# Patient Record
Sex: Female | Born: 1985 | Race: White | Hispanic: No | Marital: Single | Smoking: Never smoker
Health system: Southern US, Community
[De-identification: ages and names within clinical notes are randomized; demographics above are authoritative.]

## PROBLEM LIST (undated history)

## (undated) DIAGNOSIS — F419 Anxiety disorder, unspecified: Secondary | ICD-10-CM

---

## 2010-01-31 HISTORY — PX: WISDOM TOOTH EXTRACTION: SHX21

## 2017-01-31 HISTORY — PX: REDUCTION MAMMAPLASTY: SUR839

## 2018-08-01 HISTORY — PX: BREAST REDUCTION SURGERY: SHX8

## 2018-12-29 ENCOUNTER — Other Ambulatory Visit: Payer: Self-pay

## 2018-12-29 ENCOUNTER — Emergency Department (HOSPITAL_COMMUNITY)
Admission: EM | Admit: 2018-12-29 | Discharge: 2018-12-29 | Disposition: A | Discharge status: Home / Self Care | Payer: BC Managed Care – PPO | Attending: Emergency Medicine | Admitting: Emergency Medicine

## 2018-12-29 ENCOUNTER — Emergency Department (HOSPITAL_COMMUNITY): Payer: BC Managed Care – PPO

## 2018-12-29 ENCOUNTER — Encounter (HOSPITAL_COMMUNITY): Payer: Self-pay

## 2018-12-29 DIAGNOSIS — S060X1A Concussion with loss of consciousness of 30 minutes or less, initial encounter: Secondary | ICD-10-CM | POA: Diagnosis not present

## 2018-12-29 DIAGNOSIS — S6291XA Unspecified fracture of right wrist and hand, initial encounter for closed fracture: Secondary | ICD-10-CM

## 2018-12-29 DIAGNOSIS — Z23 Encounter for immunization: Secondary | ICD-10-CM | POA: Diagnosis not present

## 2018-12-29 DIAGNOSIS — T148XXA Other injury of unspecified body region, initial encounter: Secondary | ICD-10-CM | POA: Diagnosis not present

## 2018-12-29 DIAGNOSIS — Y9355 Activity, bike riding: Secondary | ICD-10-CM | POA: Insufficient documentation

## 2018-12-29 DIAGNOSIS — Y9289 Other specified places as the place of occurrence of the external cause: Secondary | ICD-10-CM | POA: Diagnosis not present

## 2018-12-29 DIAGNOSIS — S0990XA Unspecified injury of head, initial encounter: Secondary | ICD-10-CM | POA: Insufficient documentation

## 2018-12-29 DIAGNOSIS — Z79899 Other long term (current) drug therapy: Secondary | ICD-10-CM | POA: Diagnosis not present

## 2018-12-29 DIAGNOSIS — S62314A Displaced fracture of base of fourth metacarpal bone, right hand, initial encounter for closed fracture: Secondary | ICD-10-CM | POA: Diagnosis not present

## 2018-12-29 DIAGNOSIS — Y999 Unspecified external cause status: Secondary | ICD-10-CM | POA: Diagnosis not present

## 2018-12-29 DIAGNOSIS — T07XXXA Unspecified multiple injuries, initial encounter: Secondary | ICD-10-CM

## 2018-12-29 DIAGNOSIS — S060XAA Concussion with loss of consciousness status unknown, initial encounter: Secondary | ICD-10-CM

## 2018-12-29 DIAGNOSIS — S060X9A Concussion with loss of consciousness of unspecified duration, initial encounter: Secondary | ICD-10-CM

## 2018-12-29 HISTORY — DX: Concussion with loss of consciousness of unspecified duration, initial encounter: S06.0X9A

## 2018-12-29 HISTORY — DX: Concussion with loss of consciousness status unknown, initial encounter: S06.0XAA

## 2018-12-29 HISTORY — DX: Anxiety disorder, unspecified: F41.9

## 2018-12-29 MED ORDER — HYDROCODONE-ACETAMINOPHEN 5-325 MG PO TABS: 1.0000 | ORAL_TABLET | ORAL | 0 refills | Status: AC | PRN

## 2018-12-29 MED ORDER — TETANUS-DIPHTH-ACELL PERTUSSIS 5-2.5-18.5 LF-MCG/0.5 IM SUSP
0.5000 mL | Freq: Once | INTRAMUSCULAR | Status: AC
  Administered 2018-12-29: 0.5 mL via INTRAMUSCULAR
  Filled 2018-12-29: qty 0.5
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
## 2018-12-29 NOTE — ED Notes (Signed)
Patient transported to X-ray 

## 2018-12-29 NOTE — Discharge Instructions (Addendum)
Testing did not show any serious problems.  You do appear to have had a concussion, following the head injury.  Avoid stress, watching video screens, and make sure you are eating and drinking well.  Call the neurologist for a follow-up appointment regarding the concussion.  Call the orthopedic doctor to be seen about the hand fracture.  Use Tylenol or Motrin for pain, and the narcotic pain reliever if needed.  Return here, if needed, for problems.

## 2018-12-29 NOTE — Progress Notes (Signed)
Orthopedic Tech Progress Note Patient Details:  Janice Gilbert 15-Jan-1986 [id number]  Ortho Devices Type of Ortho Device: Ace wrap, Ulna gutter splint Ortho Device/Splint Location: right Ortho Device/Splint Interventions: Application   Post Interventions Patient Tolerated: Well Instructions Provided: Care of device   [place] Pink 12/29/2018, 7:05 PM
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
## 2019-01-01 ENCOUNTER — Other Ambulatory Visit: Payer: Self-pay

## 2019-01-01 ENCOUNTER — Encounter: Payer: Self-pay | Admitting: Diagnostic Neuroimaging

## 2019-01-01 ENCOUNTER — Ambulatory Visit: Payer: BC Managed Care – PPO | Admitting: Diagnostic Neuroimaging

## 2019-01-01 VITALS — BP 141/93 | HR 90 | Temp 97.8°F | Ht 68.0 in | Wt 183.4 lb

## 2019-01-01 DIAGNOSIS — S060X1A Concussion with loss of consciousness of 30 minutes or less, initial encounter: Secondary | ICD-10-CM
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
## 2019-01-01 NOTE — Patient Instructions (Signed)
-   gradually increase activity as tolerated  - return to work on Jan 14, 2019 (or earlier if symptoms improve)

## 2019-01-03 ENCOUNTER — Encounter: Payer: Self-pay | Admitting: Orthopaedic Surgery

## 2019-01-03 ENCOUNTER — Ambulatory Visit (INDEPENDENT_AMBULATORY_CARE_PROVIDER_SITE_OTHER): Payer: BC Managed Care – PPO | Admitting: Orthopaedic Surgery

## 2019-01-03 ENCOUNTER — Other Ambulatory Visit: Payer: Self-pay

## 2019-01-03 DIAGNOSIS — S62316A Displaced fracture of base of fifth metacarpal bone, right hand, initial encounter for closed fracture: Secondary | ICD-10-CM
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
## 2019-02-05 ENCOUNTER — Ambulatory Visit: Payer: BC Managed Care – PPO | Admitting: Orthopaedic Surgery

## 2019-02-05 ENCOUNTER — Ambulatory Visit: Payer: Self-pay

## 2019-02-05 ENCOUNTER — Other Ambulatory Visit: Payer: Self-pay

## 2019-02-05 DIAGNOSIS — S62316A Displaced fracture of base of fifth metacarpal bone, right hand, initial encounter for closed fracture: Secondary | ICD-10-CM | POA: Diagnosis not present
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
## 2019-03-31 ENCOUNTER — Ambulatory Visit: Payer: BC Managed Care – PPO | Attending: Internal Medicine

## 2019-03-31 DIAGNOSIS — Z23 Encounter for immunization: Secondary | ICD-10-CM

## 2019-03-31 NOTE — Progress Notes (Signed)
   Covid-19 Vaccination Clinic  Name:  Janice Gilbert    MRN: [id number] DOB: 12-Mar-1985  03/31/2019  [name] was observed post Covid-19 immunization for 15 minutes without incidence. She was provided with Vaccine Information Sheet and instruction to access the V-Safe system.   [name] was instructed to call 911 with any severe reactions post vaccine: Marland Kitchen Difficulty breathing  . Swelling of your face and throat  . A fast heartbeat  . A bad rash all over your body  . Dizziness and weakness    Immunizations Administered    Name Date Dose VIS Date Route   Pfizer COVID-19 Vaccine 03/31/2019 10:35 AM 0.3 mL 01/11/2019 Intramuscular   Manufacturer: [employer], [employer]   Lot: [id number]   NDC: [address]-5

## 2019-04-22 ENCOUNTER — Ambulatory Visit: Payer: BC Managed Care – PPO | Attending: Internal Medicine

## 2019-04-22 DIAGNOSIS — Z23 Encounter for immunization: Secondary | ICD-10-CM

## 2019-04-22 NOTE — Progress Notes (Signed)
   Covid-19 Vaccination Clinic  Name:  Janice Gilbert    MRN: [id number] DOB: [birth year]  04/22/2019  [name] was observed post Covid-19 immunization for 15 minutes without incident. She was provided with Vaccine Information Sheet and instruction to access the V-Safe system.   [name] was instructed to call 911 with any severe reactions post vaccine: Marland Kitchen Difficulty breathing  . Swelling of face and throat  . A fast heartbeat  . A bad rash all over body  . Dizziness and weakness   Immunizations Administered    Name Date Dose VIS Date Route   Pfizer COVID-19 Vaccine 04/22/2019 10:00 AM 0.3 mL 01/11/2019 Intramuscular   Manufacturer: [employer], [employer]   Lot: [id number]   NDC: [address]-9

## 2020-03-12 ENCOUNTER — Other Ambulatory Visit: Payer: Self-pay | Admitting: Family Medicine

## 2020-03-12 DIAGNOSIS — N6310 Unspecified lump in the right breast, unspecified quadrant: Secondary | ICD-10-CM

## 2020-03-12 DIAGNOSIS — N6320 Unspecified lump in the left breast, unspecified quadrant: Secondary | ICD-10-CM

## 2020-03-12 DIAGNOSIS — Z1231 Encounter for screening mammogram for malignant neoplasm of breast: Secondary | ICD-10-CM

## 2020-03-20 ENCOUNTER — Inpatient Hospital Stay
Admission: RE | Admit: 2020-03-20 | Discharge: 2020-03-20 | Disposition: A | Discharge status: Home / Self Care | Payer: Self-pay | Source: Ambulatory Visit | Attending: *Deleted | Admitting: *Deleted

## 2020-03-20 ENCOUNTER — Other Ambulatory Visit: Payer: Self-pay | Admitting: *Deleted

## 2020-03-20 DIAGNOSIS — Z1231 Encounter for screening mammogram for malignant neoplasm of breast: Secondary | ICD-10-CM

## 2020-03-31 ENCOUNTER — Other Ambulatory Visit: Payer: Self-pay

## 2020-03-31 ENCOUNTER — Ambulatory Visit
Admission: RE | Admit: 2020-03-31 | Discharge: 2020-03-31 | Disposition: A | Discharge status: Home / Self Care | Payer: BC Managed Care – PPO | Source: Ambulatory Visit | Attending: Family Medicine | Admitting: Family Medicine

## 2020-03-31 DIAGNOSIS — Z1231 Encounter for screening mammogram for malignant neoplasm of breast: Secondary | ICD-10-CM

## 2020-03-31 DIAGNOSIS — N6310 Unspecified lump in the right breast, unspecified quadrant: Secondary | ICD-10-CM | POA: Insufficient documentation

## 2020-10-09 ENCOUNTER — Other Ambulatory Visit: Payer: Self-pay | Admitting: Adult Health

## 2020-10-09 ENCOUNTER — Other Ambulatory Visit (HOSPITAL_COMMUNITY): Payer: Self-pay | Admitting: Adult Health

## 2020-10-09 DIAGNOSIS — N939 Abnormal uterine and vaginal bleeding, unspecified: Secondary | ICD-10-CM

## 2020-10-15 ENCOUNTER — Ambulatory Visit (HOSPITAL_COMMUNITY)
Admission: RE | Admit: 2020-10-15 | Discharge: 2020-10-15 | Disposition: A | Discharge status: Home / Self Care | Payer: BC Managed Care – PPO | Source: Ambulatory Visit | Attending: Adult Health | Admitting: Adult Health

## 2020-10-15 ENCOUNTER — Other Ambulatory Visit: Payer: Self-pay

## 2020-10-15 DIAGNOSIS — N939 Abnormal uterine and vaginal bleeding, unspecified: Secondary | ICD-10-CM | POA: Insufficient documentation

## 2021-07-04 IMAGING — MG DIGITAL DIAGNOSTIC BILAT W/ TOMO W/ CAD
8 series · 8 of 24 positions shown · non-contrast
Comparison: 02/25/2015

CLINICAL DATA: Patient presents for bilateral diagnostic exam due
to a palpable abnormality over the 6 o'clock position of the right
breast felt by her healthcare provider. Patient is post bilateral
reduction mammoplasty 5847. No significant family history breast
cancer.

EXAM:
DIGITAL DIAGNOSTIC BILATERAL MAMMOGRAM WITH TOMOSYNTHESIS AND CAD;
ULTRASOUND RIGHT BREAST LIMITED
TECHNIQUE: Bilateral digital diagnostic mammography and breast tomosynthesis
was performed. The images were evaluated with computer-aided
detection.; Targeted ultrasound examination of the right breast was
performed

[R MLO synth-2D]
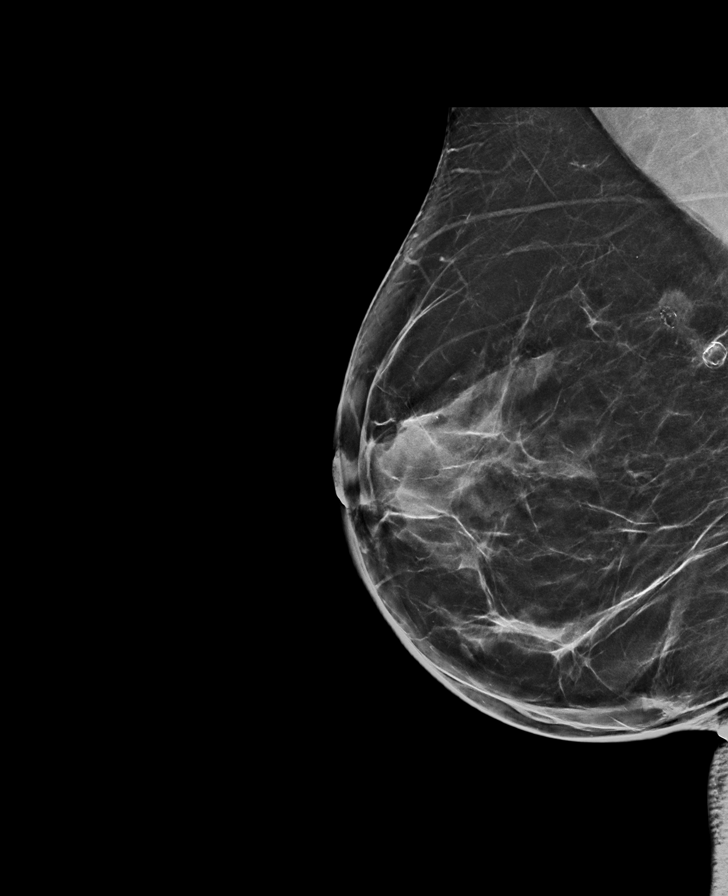

[L CC synth-2D]
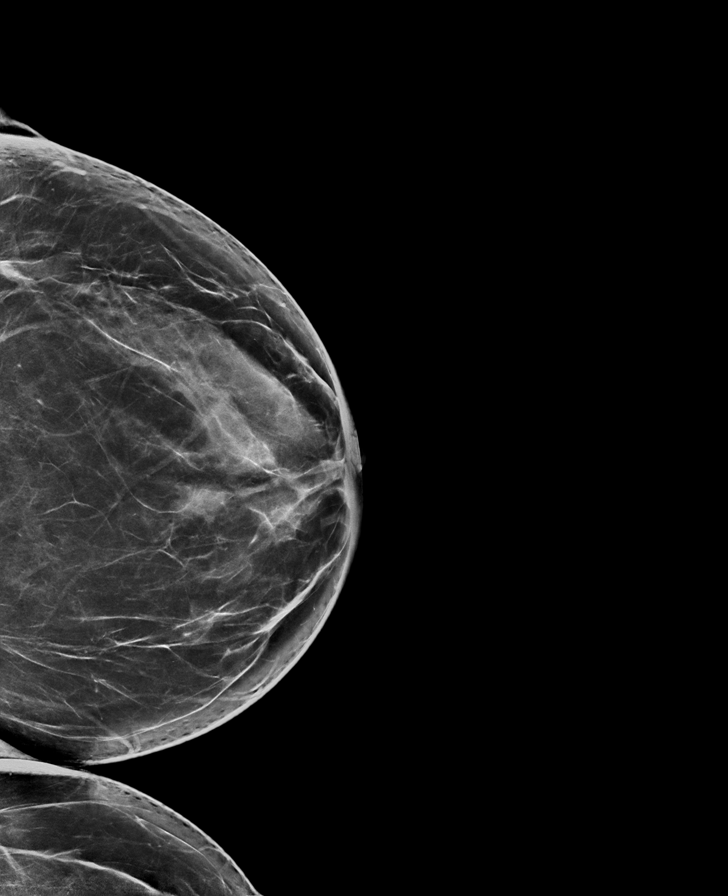

[L MLO synth-2D]
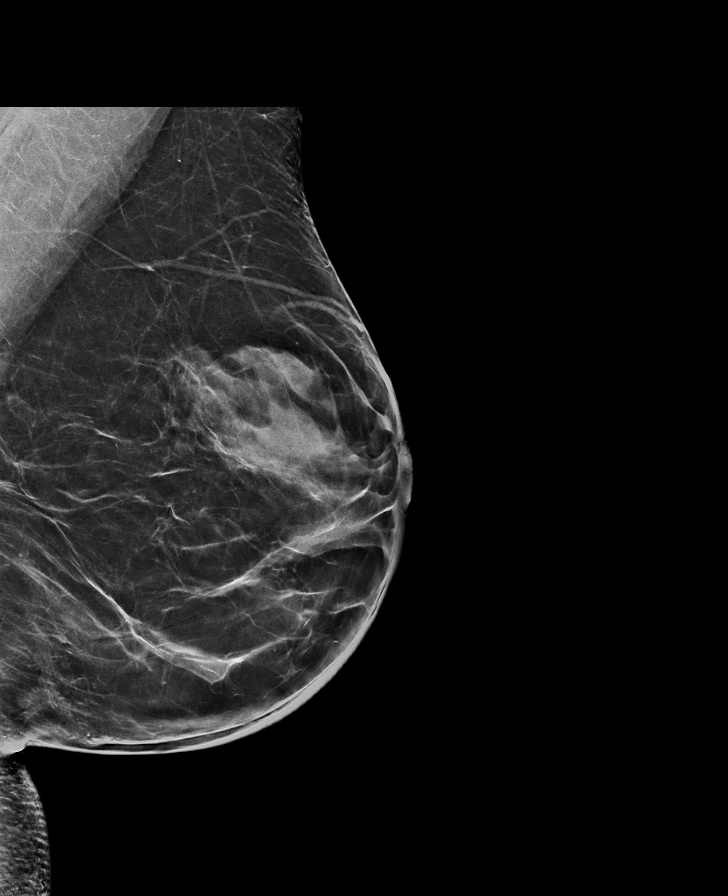

[R CC synth-2D]
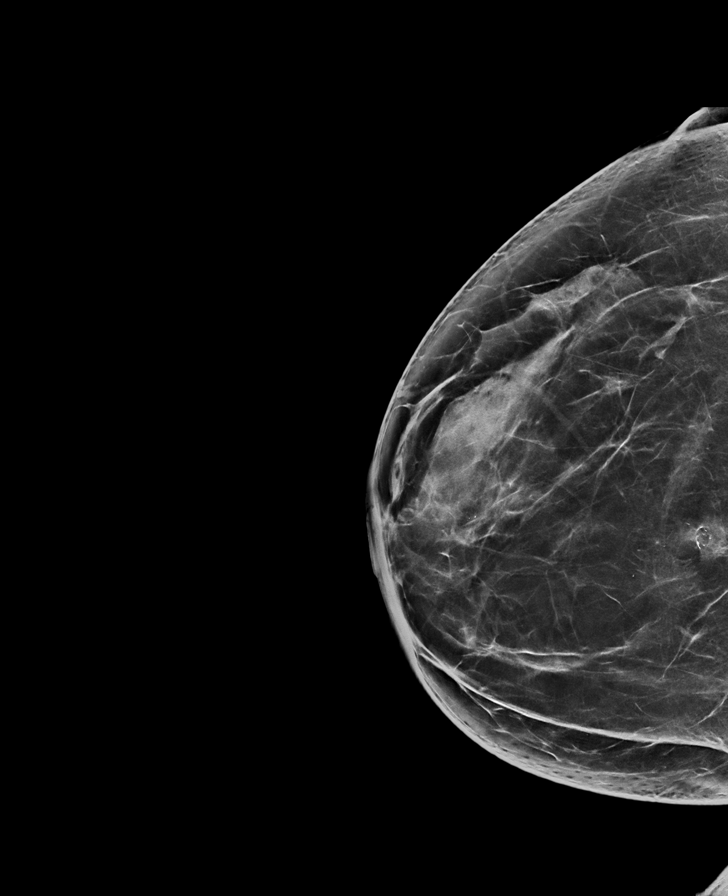

[R CC tomo · tomo slice 39/77.0]
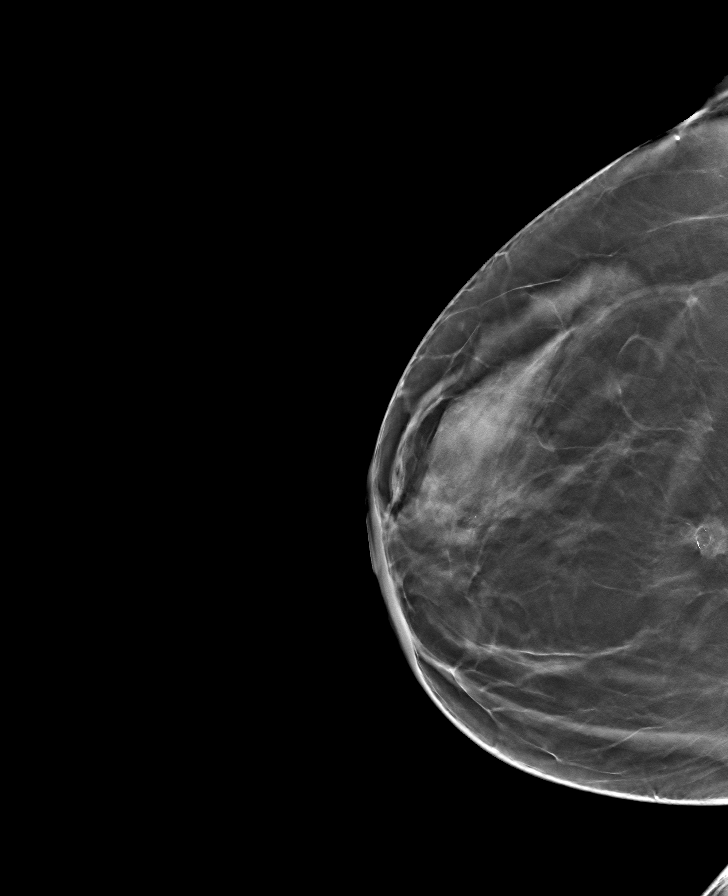

[L CC tomo · tomo slice 41/81.0]
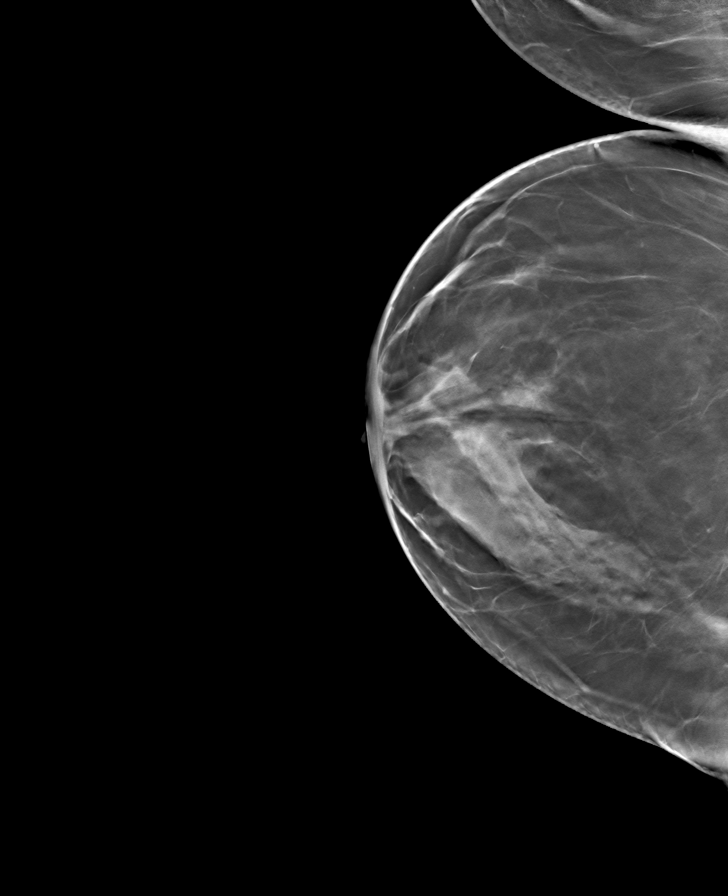

[L MLO tomo · tomo slice 40/79.0]
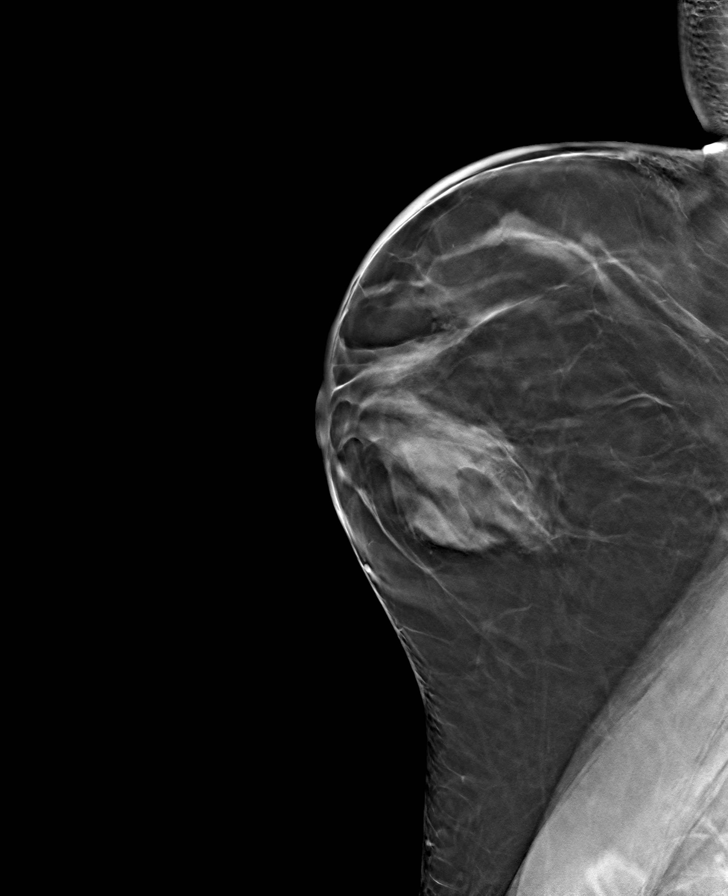

[R MLO tomo · tomo slice 39/77.0]
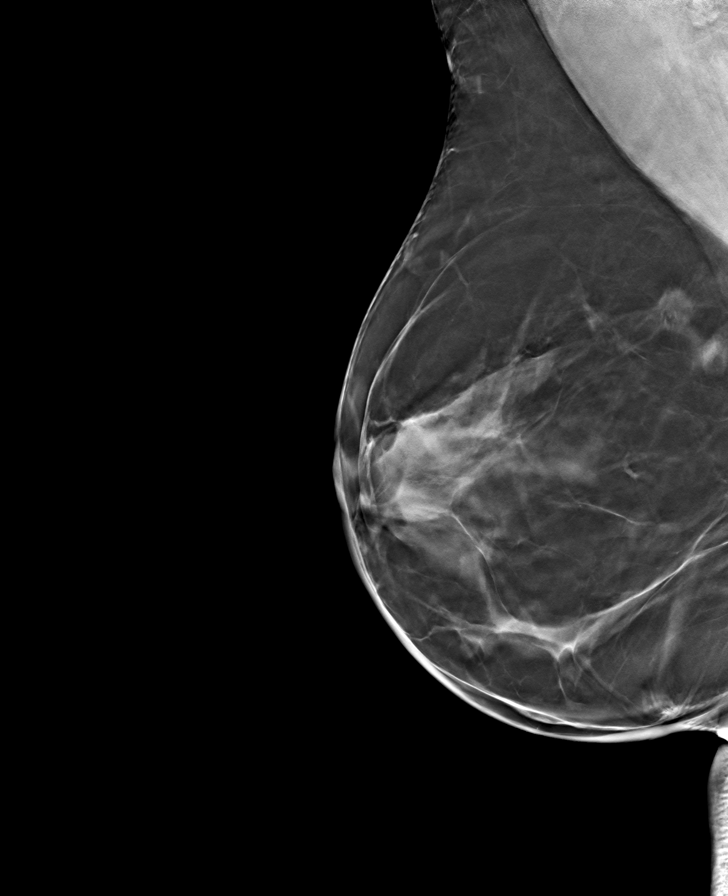

[8 of 24 positions shown; findings below may reference images not displayed]

ACR Breast Density Category c: The breast tissue is heterogeneously
dense, which may obscure small masses.
FINDINGS: Examination demonstrates evidence of patient's previous bilateral
reduction mammoplasty. There is a calcified oil cyst over the
posterior right upper breast compatible with fat necrosis related to
patient's reduction mammoplasty. There is an adjacent irregular
density with associated dystrophic calcifications almost certainly a
second adjacent area of fat necrosis. Remainder of the right breast
as well as the left breast is unremarkable.

Targeted ultrasound is performed, showing no focal abnormality over
the 6 o'clock position of the right breast to account for patient's
palpable abnormality. Ultrasound over the 1 o'clock position of the
right breast 5 cm from the nipple demonstrates an area of echogenic
tissue deep with central oval cystic areas 1 of which demonstrates
rim calcification compatible with the fat necrosis seen
mammographically. No suspicious abnormality in this region.
IMPRESSION: 1. No focal abnormality over the 6 o'clock position of the right
breast to account for patient's palpable abnormality.

2. Focus of probable fat necrosis over the posterior third of the
inner upper right breast likely related patient's reduction
mammoplasty.

RECOMMENDATION:
As a precaution, would recommend a follow-up diagnostic right breast
mammogram in 6 months to document stability/evolution of this
probable fat necrosis. Also recommend continued management of
patient's right breast palpable abnormality on a clinical basis

I have discussed the findings and recommendations with the patient.
If applicable, a reminder letter will be sent to the patient
regarding the next appointment.

BI-RADS CATEGORY  3: Probably benign.

## 2021-07-04 IMAGING — US US BREAST*R* LIMITED INC AXILLA
1 series · 7 of 7 positions shown · non-contrast
Comparison: 02/25/2015

CLINICAL DATA: Patient presents for bilateral diagnostic exam due
to a palpable abnormality over the 6 o'clock position of the right
breast felt by her healthcare provider. Patient is post bilateral
reduction mammoplasty 5847. No significant family history breast
cancer.

EXAM:
DIGITAL DIAGNOSTIC BILATERAL MAMMOGRAM WITH TOMOSYNTHESIS AND CAD;
ULTRASOUND RIGHT BREAST LIMITED
TECHNIQUE: Bilateral digital diagnostic mammography and breast tomosynthesis
was performed. The images were evaluated with computer-aided
detection.; Targeted ultrasound examination of the right breast was
performed

[Series 1: us breast*right* limited inc axilla · 0.07mm/px · 7 of 7 slices shown]
[im 1/7]
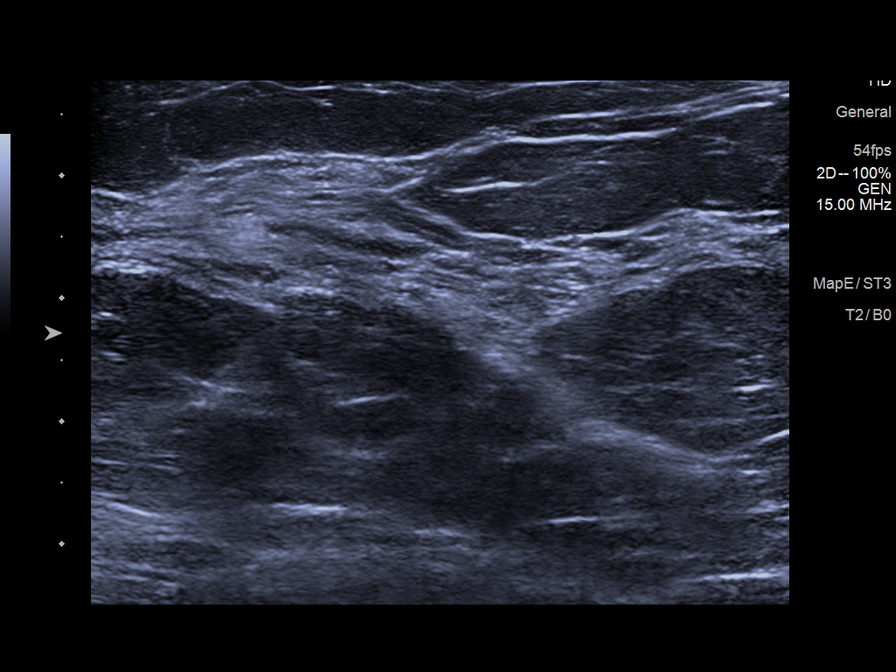
[im 2/7]
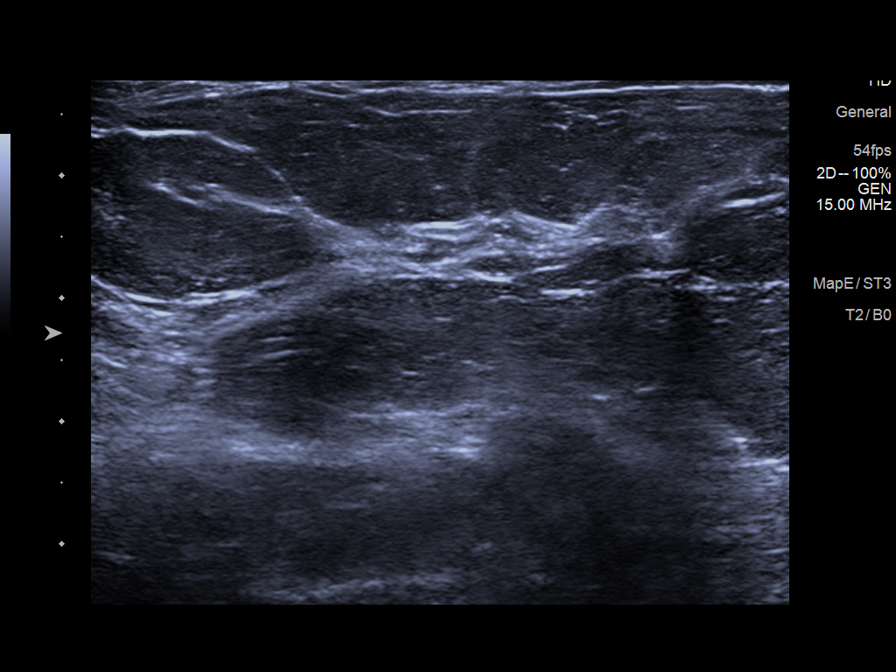
[im 3/7]
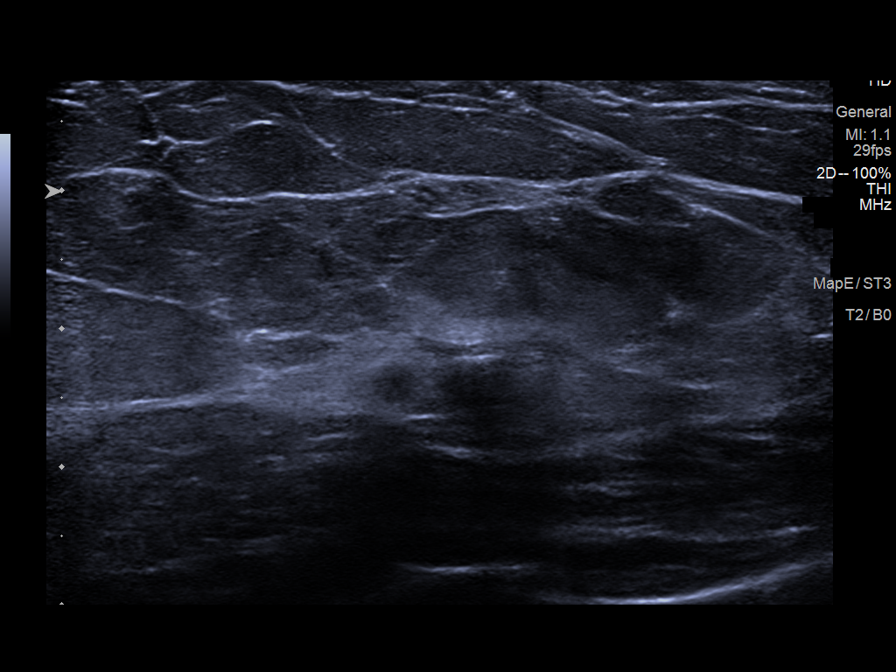
[im 4/7]
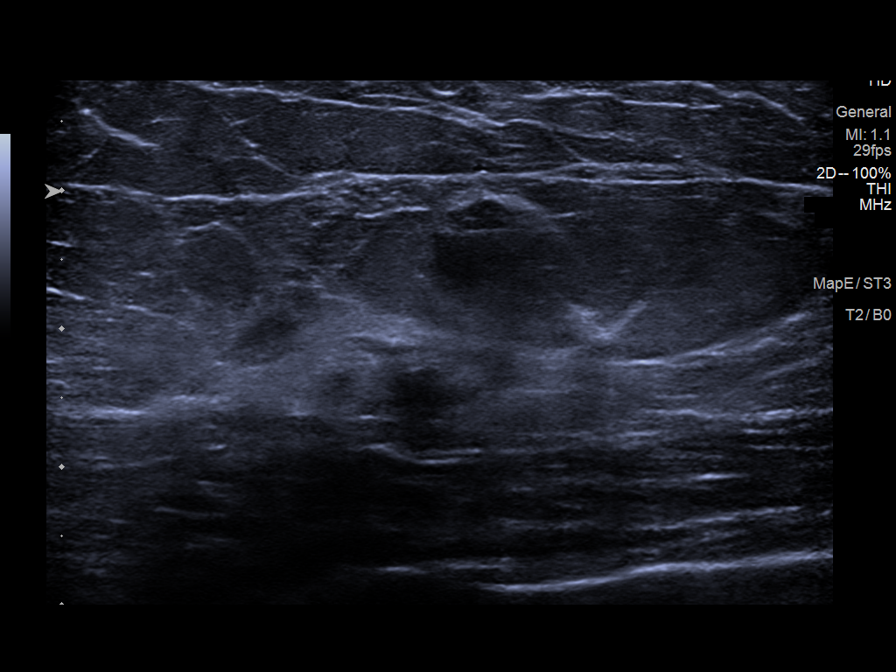
[im 5/7]
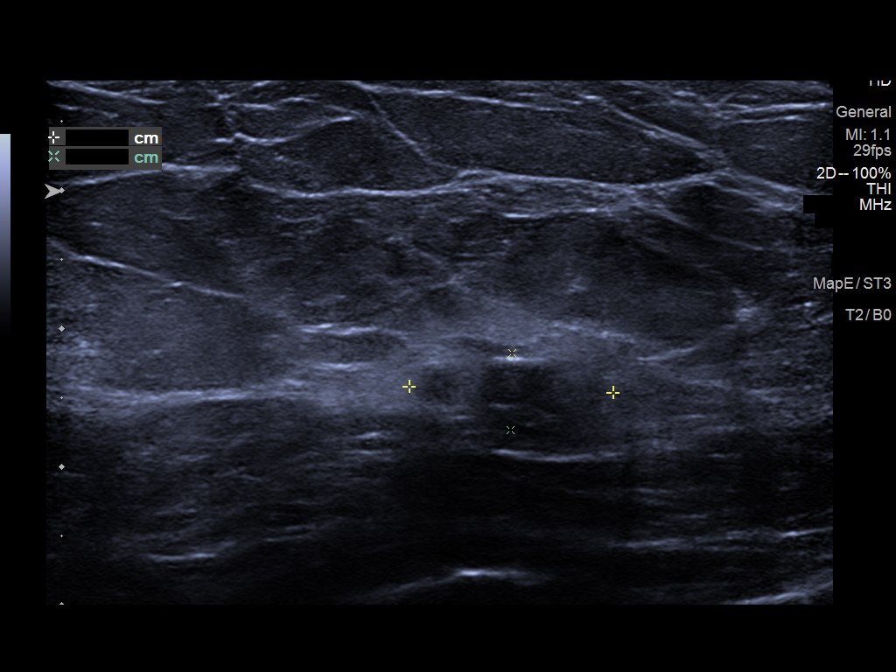
[im 6/7]
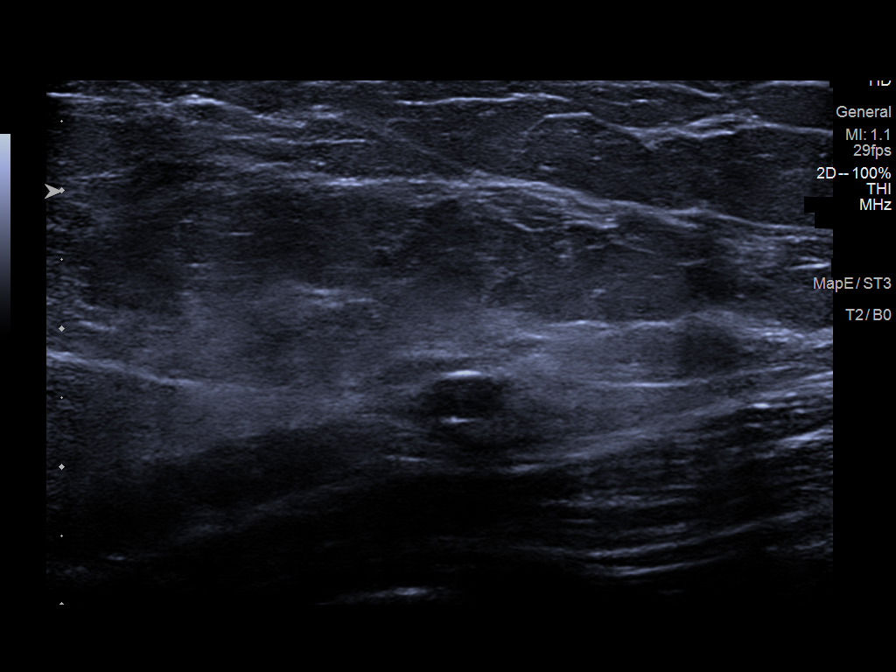
[im 7/7]
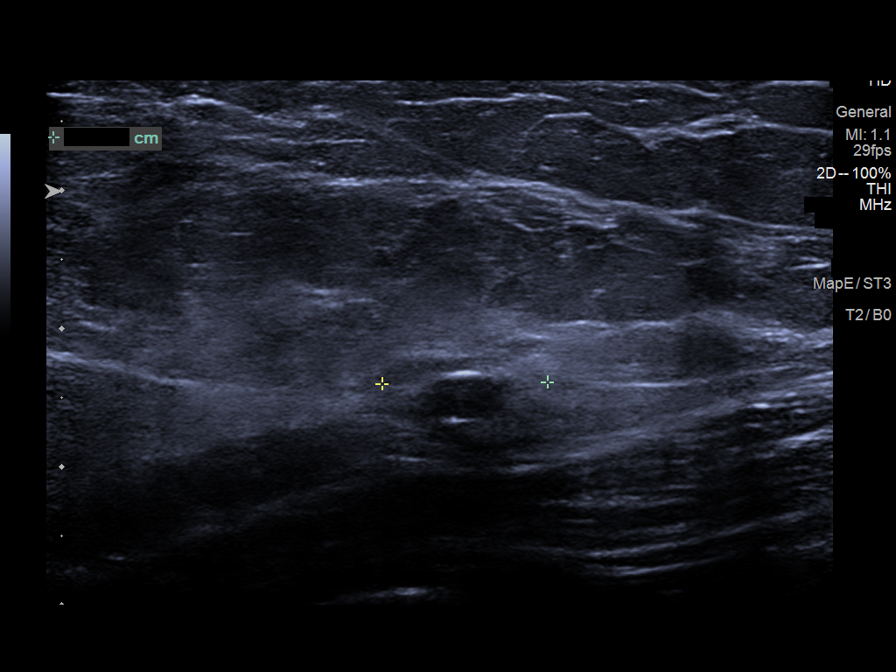

[7 of 7 positions shown; findings below may reference images not displayed]

ACR Breast Density Category c: The breast tissue is heterogeneously
dense, which may obscure small masses.
FINDINGS: Examination demonstrates evidence of patient's previous bilateral
reduction mammoplasty. There is a calcified oil cyst over the
posterior right upper breast compatible with fat necrosis related to
patient's reduction mammoplasty. There is an adjacent irregular
density with associated dystrophic calcifications almost certainly a
second adjacent area of fat necrosis. Remainder of the right breast
as well as the left breast is unremarkable.

Targeted ultrasound is performed, showing no focal abnormality over
the 6 o'clock position of the right breast to account for patient's
palpable abnormality. Ultrasound over the 1 o'clock position of the
right breast 5 cm from the nipple demonstrates an area of echogenic
tissue deep with central oval cystic areas 1 of which demonstrates
rim calcification compatible with the fat necrosis seen
mammographically. No suspicious abnormality in this region.
IMPRESSION: 1. No focal abnormality over the 6 o'clock position of the right
breast to account for patient's palpable abnormality.

2. Focus of probable fat necrosis over the posterior third of the
inner upper right breast likely related patient's reduction
mammoplasty.

RECOMMENDATION:
As a precaution, would recommend a follow-up diagnostic right breast
mammogram in 6 months to document stability/evolution of this
probable fat necrosis. Also recommend continued management of
patient's right breast palpable abnormality on a clinical basis

I have discussed the findings and recommendations with the patient.
If applicable, a reminder letter will be sent to the patient
regarding the next appointment.

BI-RADS CATEGORY  3: Probably benign.
